# Patient Record
Sex: Male | Born: 1960 | Race: Black or African American | Hispanic: No | Marital: Single | Smoking: Current every day smoker
Health system: Southern US, Community
[De-identification: ages and names within clinical notes are randomized; demographics above are authoritative.]

## PROBLEM LIST (undated history)

## (undated) DIAGNOSIS — C61 Malignant neoplasm of prostate: Secondary | ICD-10-CM

## (undated) DIAGNOSIS — E7800 Pure hypercholesterolemia, unspecified: Secondary | ICD-10-CM

## (undated) DIAGNOSIS — I10 Essential (primary) hypertension: Secondary | ICD-10-CM

## (undated) HISTORY — PX: VASECTOMY: SHX75

## (undated) HISTORY — PX: RADIOACTIVE SEED IMPLANT: SHX5150

## (undated) HISTORY — PX: PROSTATE BIOPSY: SHX241

---

## 1991-03-27 HISTORY — PX: VASECTOMY: SHX75

## 2016-06-26 HISTORY — PX: COLONOSCOPY: SHX174

## 2019-10-03 HISTORY — PX: PROSTATE BIOPSY: SHX241

## 2019-12-17 HISTORY — PX: OTHER SURGICAL HISTORY: SHX169

## 2020-03-11 DIAGNOSIS — C61 Malignant neoplasm of prostate: Secondary | ICD-10-CM | POA: Insufficient documentation

## 2020-04-28 ENCOUNTER — Ambulatory Visit
Admission: RE | Admit: 2020-04-28 | Discharge: 2020-04-28 | Disposition: A | Discharge status: Home / Self Care | Payer: Self-pay | Source: Ambulatory Visit | Attending: Radiation Oncology | Admitting: Radiation Oncology

## 2020-04-28 ENCOUNTER — Other Ambulatory Visit: Payer: Self-pay | Admitting: Radiation Oncology

## 2020-04-28 ENCOUNTER — Telehealth: Payer: Self-pay | Admitting: *Deleted

## 2020-04-28 DIAGNOSIS — C61 Malignant neoplasm of prostate: Secondary | ICD-10-CM

## 2020-04-28 NOTE — Telephone Encounter (Signed)
Called patient to inform of  and sim for 04-29-20, spoke with patient and he is aware of these appts.

## 2020-04-29 ENCOUNTER — Institutional Professional Consult (permissible substitution): Payer: Self-pay | Admitting: Radiation Oncology

## 2020-04-29 ENCOUNTER — Encounter: Payer: Self-pay | Admitting: Radiation Oncology

## 2020-04-29 ENCOUNTER — Ambulatory Visit
Admission: RE | Admit: 2020-04-29 | Discharge: 2020-04-29 | Disposition: A | Discharge status: Home / Self Care | Payer: No Typology Code available for payment source | Source: Ambulatory Visit | Attending: Radiation Oncology | Admitting: Radiation Oncology

## 2020-04-29 ENCOUNTER — Other Ambulatory Visit: Payer: Self-pay

## 2020-04-29 VITALS — BP 126/78 | HR 67 | Temp 97.2°F | Resp 18 | Ht 66.0 in | Wt 187.0 lb

## 2020-04-29 DIAGNOSIS — C61 Malignant neoplasm of prostate: Secondary | ICD-10-CM

## 2020-04-29 DIAGNOSIS — Z510 Encounter for antineoplastic radiation therapy: Secondary | ICD-10-CM | POA: Insufficient documentation

## 2020-04-29 HISTORY — DX: Pure hypercholesterolemia, unspecified: E78.00

## 2020-04-29 HISTORY — DX: Malignant neoplasm of prostate: C61

## 2020-04-29 NOTE — Progress Notes (Signed)
GU Location of Tumor / Histology: prostatic adenocarcinoma  If Prostate Cancer, Gleason Score is (4 + 4) and PSA is (27.7)  Anthony Stokes presented to the [facility] hospital for routine physical in March 2021. His PSA was noted to be 27.7. He has been referred here for brachytherapy.  Biopsies of prostate (if applicable) revealed:    Past/Anticipated interventions by urology, if any: prostate biopsy, ADT, CT scan, bone scan, referral for radiation  Past/Anticipated interventions by medical oncology, if any: no  Weight changes, if any: no  Bowel/Bladder complaints, if any: IPSS 11. SHIM 1. No sexual intercourse since June 30 when he received ADT. Reports hot flashes associated with ADT. Denies dysuria, hematuria or urinary leakage. Denies diarrhea.   Nausea/Vomiting, if any: no  Pain issues, if any:  no  SAFETY ISSUES:  Prior radiation? yes  Pacemaker/ICD? no  Possible current pregnancy? no  Is the patient on methotrexate? no  Current Complaints / other details:  59 year old male. Resides in [place], [place].

## 2020-04-29 NOTE — Progress Notes (Signed)
Radiation Oncology         [id number]) [phone] ________________________________  Initial outpatient Consultation  Name: Anthony Stokes MRN: [id number]  Date: 04/29/2020  DOB: 1961-03-04  CC:White, French Ana, MD   REFERRING PHYSICIAN: Meda Klinefelter, MD  DIAGNOSIS: 59 y.o. gentleman with Stage T1C adenocarcinoma of the prostate with Gleason Score of 4+4, and PSA of 27.71.    ICD-10-CM   1. Malignant neoplasm of prostate ([place])  C61     HISTORY OF PRESENT ILLNESS: Anthony Stokes is a 59 y.o. male with a diagnosis of prostate cancer. He has a history of elevated PSA since at least 2018 when his PSA was 8.  He had an attempted  TRUSPBx in the office with Dr.  Sanjuana Letters at that time but was unable to tolerate the procedure.  The plan was to schedule prostate biopsy under anesthesia but the patient never followed up.  More recently, he was noted to have an elevated PSA of 27.71 by his primary care physician at the [facility].  Accordingly, he was referred for evaluation in urology by Dr. Eugenio Hoes in 08/2019,  digital rectal examination was performed at that time revealing no nodules or other concerning findings.  The patient proceeded to transrectal ultrasound with 12 biopsies of the prostate on 10/03/19.  We do not have documentation of the prostate volume.  Out of 12 core biopsies, 7 were positive.  The maximum Gleason score was 4+4, and this was seen in 2 cores from the right middle lobe, one core from the left base, and 2 cores from the left middle lobe.  Additionally, Gleason 4+3 was seen in 2 cores from the left apex.  He underwent CT A/P and bone scan for disease staging.  The bone scan was performed on 10/27/2019 and was suspicious for an lesion in the posterior lateral right eighth rib as well as increased uptake in the right cheek.  The patient reported no prior trauma.  CT A/P performed on 10/23/2019 was negative for evidence of lymphadenopathy, visceral or osseous metastatic disease.  The  patient proceeded to [place] PET scan for further evaluation of the abnormal findings on bone scan and this study did not show any evidence of metastatic disease.  He met with Dr. Derrill Center and Dr. Derinda Sis in radiation oncology at [facility], both of whom recommended LT-ADT concurrent with 5 weeks of daily EBRT to the prostate and pelvic lymph nodes followed by a brachytherapy boost procedure.  The patient started ADT on 12/24/2019.  He recently completed the 5-week course of daily radiotherapy on Monday, 04/26/2020 and unfortunately, Dr. Mina Marble is no longer with the radiation oncology department at [facility].  Therefore, Dr. Francesca Jewett reached out to [place] for consideration of the brachytherapy seed boost here in [place], [place].  The patient has kindly been referred today for discussion of a brachytherapy seed boost here, locally.   PREVIOUS RADIATION THERAPY: Yes 25 fractions of IMRT to the prostate and pelvic lymph nodes under the care of Dr. Derrill Center at [facility]).  PAST MEDICAL HISTORY:  Past Medical History:  Diagnosis Date  . Hypercholesterolemia   . Prostate cancer ([place])       PAST SURGICAL HISTORY: Past Surgical History:  Procedure Laterality Date  . COLONOSCOPY  2018  . PROSTATE BIOPSY    . VASECTOMY      FAMILY HISTORY:  Family History  Problem Relation Age of Onset  . Breast cancer Sister   . Uterine cancer Sister  SOCIAL HISTORY:  Social History   Socioeconomic History  . Marital status: Single    Spouse name: Not on file  . Number of children: Not on file  . Years of education: Not on file  . Highest education level: Not on file  Occupational History  . Not on file  Tobacco Use  . Smoking status: Current Every Day Smoker    Years: 15.00    Types: Cigars  . Smokeless tobacco: Never Used  Vaping Use  . Vaping Use: Never used  Substance and Sexual Activity  . Alcohol use: Never  . Drug use: Never  . Sexual activity: Not Currently  Other Topics Concern  . Not on  file  Social History Narrative  . Not on file   Social Determinants of Health   Financial Resource Strain:   . Difficulty of Paying Living Expenses: Not on file  Food Insecurity:   . Worried About Charity fundraiser in the Last Year: Not on file  . Ran Out of Food in the Last Year: Not on file  Transportation Needs:   . Lack of Transportation (Medical): Not on file  . Lack of Transportation (Non-Medical): Not on file  Physical Activity:   . Days of Exercise per Week: Not on file  . Minutes of Exercise per Session: Not on file  Stress:   . Feeling of Stress : Not on file  Social Connections:   . Frequency of Communication with Friends and Family: Not on file  . Frequency of Social Gatherings with Friends and Family: Not on file  . Attends Religious Services: Not on file  . Active Member of Clubs or Organizations: Not on file  . Attends Archivist Meetings: Not on file  . Marital Status: Not on file  Intimate Partner Violence:   . Fear of Current or Ex-Partner: Not on file  . Emotionally Abused: Not on file  . Physically Abused: Not on file  . Sexually Abused: Not on file    ALLERGIES: Patient has no known allergies.  MEDICATIONS:  Current Outpatient Medications  Medication Sig Dispense Refill  . atorvastatin (LIPITOR) 20 MG tablet Take by mouth.    . bicalutamide (CASODEX) 50 MG tablet Take by mouth.    Marland Kitchen lisinopril (ZESTRIL) 20 MG tablet Take by mouth.     No current facility-administered medications for this encounter.    REVIEW OF SYSTEMS:  On review of systems, the patient reports that he is doing well overall. He denies any chest pain, shortness of breath, cough, fevers, chills, night sweats, unintended weight changes. He denies any bowel disturbances, and denies abdominal pain, nausea or vomiting. He denies any new musculoskeletal or joint aches or pains. His IPSS was 11, indicating moderate urinary symptoms with increased frequency, hesitancy, weak stream  and nocturia x2. His SHIM was 1, indicating he has severe erectile dysfunction since starting ADT in June 2021. A complete review of systems is obtained and is otherwise negative.    PHYSICAL EXAM:  Wt Readings from Last 3 Encounters:  04/29/20 187 lb (84.8 kg)   Temp Readings from Last 3 Encounters:  04/29/20 (!) 97.2 F (36.2 C) (Temporal)   BP Readings from Last 3 Encounters:  04/29/20 126/78   Pulse Readings from Last 3 Encounters:  04/29/20 67   Pain Assessment Pain Score: 0-No pain/10  In general this is a well appearing African-American male in no acute distress. He is alert and oriented x4 and appropriate throughout the examination.  HEENT reveals that the patient is normocephalic, atraumatic. EOMs are intact. PERRLA. Skin is intact without any evidence of gross lesions. Cardiopulmonary assessment is negative for acute distress and breathing exhibits normal effort.  The abdomen is soft, non tender, non distended. Lower extremities are negative for pretibial pitting edema, deep calf tenderness, cyanosis or clubbing.   KPS = 100  100 - Normal; no complaints; no evidence of disease. 90   - Able to carry on normal activity; minor signs or symptoms of disease. 80   - Normal activity with effort; some signs or symptoms of disease. 77   - Cares for self; unable to carry on normal activity or to do active work. 60   - Requires occasional assistance, but is able to care for most of his personal needs. 50   - Requires considerable assistance and frequent medical care. 7   - Disabled; requires special care and assistance. 10   - Severely disabled; hospital admission is indicated although death not imminent. 54   - Very sick; hospital admission necessary; active supportive treatment necessary. 10   - Moribund; fatal processes progressing rapidly. 0     - Dead  Karnofsky DA, Abelmann WH, Craver LS and Burchenal JH [phone]) The use of the nitrogen mustards in the palliative treatment of  carcinoma: with particular reference to bronchogenic carcinoma Cancer 1 [phone]  LABORATORY DATA:  No results found for: WBC, HGB, HCT, MCV, PLT No results found for: NA, K, CL, CO2 No results found for: ALT, AST, GGT, ALKPHOS, BILITOT   RADIOGRAPHY: No results found.    IMPRESSION/PLAN: 1. 59 y.o. gentleman with Stage T1C adenocarcinoma of the prostate with Gleason Score of 4+4, and PSA of 27. We discussed the patient's workup and outlined the nature of prostate cancer in this setting. The patient's T stage, Gleason's score, and PSA put him into the high risk group. Accordingly, he is eligible for a variety of potential treatment options including prostatectomy, or LT-ADT in combination with either 8 weeks of external radiation or 5 weeks of external radiation followed by brachytherapy boost.  After a thorough discussion regarding the treatment options, the patient was adamant that he did not want to proceed with surgical prostatectomy and instead opted to proceed with the ADT concurrent with combination radiation therapy with prostate IMRT followed by brachytherapy boost, initially planning to complete treatment with his doctors at [facility].  He has since completed the 5 weeks of prostate and pelvic IMRT but Dr. Mina Marble has left the radiation oncology department at [facility] and therefore unavailable to provide the brachytherapy boost procedure.  Today, we discussed the available radiation techniques, and focused on the details and logistics of delivery as it pertains to brachytherapy.  We discussed and outlined the risks, benefits, short and long-term effects associated with radiotherapy and compared and contrasted these with prostatectomy. We discussed the role of SpaceOAR gel in reducing the rectal toxicity associated with radiotherapy.  The patient is well-informed regarding the role of ADT in the treatment of high risk prostate cancer and the associated side effects that could be expected with this therapy.  He  was started on ADT on 12/24/2019 and continues to tolerate this fairly well despite periodic hot flashes, decreased stamina and significantly decreased libido.  He appears to have a good understanding of his disease and our treatment recommendations which are of curative intent.  He was encouraged to ask questions that were answered to his stated satisfaction.  At the conclusion of our conversation,  the patient is interested in moving forward with the recommended brachytherapy boost and use of SpaceOAR gel to reduce rectal toxicity from radiotherapy.  We will share our discussion with his medical team at [facility] as well as coordinate this procedure with one of the local urologists at [facility] here in [place]. He has freely provided written consent to proceed today in the office and a copy of this document will be placed in his medical record.  He will have the CT SIM pubic arch study today, following our visit in preparation for proceeding with the brachytherapy boost procedure in the near future.  The patient met briefly with Romie Jumper in our office who will be working closely with him to coordinate OR scheduling and pre and post procedure appointments.  We will contact the pharmaceutical rep to ensure that [employer] is available at the time of procedure.  We enjoyed meeting him today and look forward to continuing to participate in his care.   Nicholos Johns, PA-C    Tyler Pita, MD  [facility] Direct Dial: [phone]  Fax: [phone] [email or web address]  Skype  LinkedIn

## 2020-05-03 ENCOUNTER — Telehealth: Payer: Self-pay | Admitting: *Deleted

## 2020-05-03 NOTE — Telephone Encounter (Signed)
RETURNED PATIENT'S PHONE CALL, SPOKE WITH PATIENT. ?

## 2020-05-04 ENCOUNTER — Other Ambulatory Visit: Payer: Self-pay | Admitting: Radiation Oncology

## 2020-05-04 DIAGNOSIS — C61 Malignant neoplasm of prostate: Secondary | ICD-10-CM

## 2020-05-06 ENCOUNTER — Telehealth: Payer: Self-pay | Admitting: *Deleted

## 2020-05-06 NOTE — Progress Notes (Signed)
  Radiation Oncology         [phone] ________________________________  Name: Anthony Stokes MRN: [id number]  Date: 04/29/2020  DOB: 23-Mar-1961  SIMULATION AND TREATMENT PLANNING NOTE PUBIC ARCH STUDY  HK:VQQVZ, Fortino Sic, LMFT  Meda Klinefelter, MD  DIAGNOSIS:  Oncology History  Prostate cancer [facility])  09/04/2019 Cancer Staging   Staging form: Prostate, AJCC 8th Edition - Clinical stage from 09/04/2019: Stage IIIA (cT1c, cN0, cM0, PSA: 27, Grade Group: 4) - Signed by Freeman Caldron, PA-C on 04/29/2020   03/11/2020 Initial Diagnosis   Prostate cancer ([place])       ICD-10-CM   1. Prostate cancer ([place])  C61     COMPLEX SIMULATION:  The patient presented today for evaluation for possible prostate seed implant. He was brought to the radiation planning suite and placed supine on the CT couch. A 3-dimensional image study set was obtained in upload to the planning computer. There, on each axial slice, I contoured the prostate gland. Then, using three-dimensional radiation planning tools I reconstructed the prostate in view of the structures from the transperineal needle pathway to assess for possible pubic arch interference. In doing so, I did not appreciate any pubic arch interference. Also, the patient's prostate volume was estimated based on the drawn structure. The volume was 21.2 cc.  Given the pubic arch appearance and prostate volume, patient remains a good candidate to proceed with prostate seed implant. Today, he freely provided informed written consent to proceed.    PLAN: The patient will undergo prostate seed implant.   ________________________________  Sheral Apley. Tammi Klippel, M.D.

## 2020-05-06 NOTE — Telephone Encounter (Signed)
Called patient to inform of implant date, spoke with patient 

## 2020-05-09 NOTE — Addendum Note (Signed)
Encounter addended by: Tyler Pita, MD on: 05/09/2020 3:27 PM  Actions taken: Medication List reviewed, Problem List reviewed, Allergies reviewed, Visit diagnoses modified

## 2020-05-10 ENCOUNTER — Other Ambulatory Visit: Payer: Self-pay | Admitting: Urology

## 2020-05-12 ENCOUNTER — Telehealth: Payer: Self-pay | Admitting: *Deleted

## 2020-05-12 NOTE — Telephone Encounter (Signed)
CALLED PATIENT TO REMIND OF LAB AND COVID TESTING, SPOKE WITH PATIENT AND HE IS AWARE OF THESE APPTS. 

## 2020-05-13 ENCOUNTER — Encounter (HOSPITAL_BASED_OUTPATIENT_CLINIC_OR_DEPARTMENT_OTHER): Payer: Self-pay | Admitting: Urology

## 2020-05-13 ENCOUNTER — Other Ambulatory Visit: Payer: Self-pay

## 2020-05-13 NOTE — Progress Notes (Signed)
Spoke w/ via phone for pre-op interview---pt Lab needs dos----none has lab appt 05-17-2020 at 1100 am for ekg, chest xray, cbc, cmet, pt, ptt               Lab results------none COVID test ------05-21-2020 at 1000 Arrive at -------900 am 05-24-2020 NPO after MN NO Solid Food.  Clear liquids from MN until---800 am then npo Medications to take morning of surgery -----pt wishes to take medications after surgery Diabetic medication -----n/a Patient Special Instructions -----fleets enema am of surgery Pre-Op special Istructions -----none Patient verbalized understanding of instructions that were given at this phone interview. Patient denies shortness of breath, chest pain, fever, cough at this phone interview.

## 2020-05-17 ENCOUNTER — Ambulatory Visit (HOSPITAL_COMMUNITY)
Admission: RE | Admit: 2020-05-17 | Discharge: 2020-05-17 | Disposition: A | Discharge status: Home / Self Care | Payer: No Typology Code available for payment source | Source: Ambulatory Visit | Attending: Urology | Admitting: Urology

## 2020-05-17 ENCOUNTER — Encounter (HOSPITAL_COMMUNITY)
Admission: RE | Admit: 2020-05-17 | Discharge: 2020-05-17 | Disposition: A | Discharge status: Home / Self Care | Payer: No Typology Code available for payment source | Source: Ambulatory Visit | Attending: Urology | Admitting: Urology

## 2020-05-17 ENCOUNTER — Other Ambulatory Visit: Payer: Self-pay

## 2020-05-17 DIAGNOSIS — Z01818 Encounter for other preprocedural examination: Secondary | ICD-10-CM | POA: Diagnosis not present

## 2020-05-17 LAB — COMPREHENSIVE METABOLIC PANEL
ALT: 31 U/L (ref 0–44)
AST: 21 U/L (ref 15–41)
Albumin: 4.7 g/dL (ref 3.5–5.0)
Alkaline Phosphatase: 72 U/L (ref 38–126)
Anion gap: 8 (ref 5–15)
BUN: 15 mg/dL (ref 6–20)
CO2: 27 mmol/L (ref 22–32)
Calcium: 9.9 mg/dL (ref 8.9–10.3)
Chloride: 103 mmol/L (ref 98–111)
Creatinine, Ser: 1.09 mg/dL (ref 0.61–1.24)
GFR, Estimated: >60 mL/min (ref >60)
Glucose, Bld: 99 mg/dL (ref 70–99)
Potassium: 4.9 mmol/L (ref 3.5–5.1)
Sodium: 138 mmol/L (ref 135–145)
Total Bilirubin: 0.7 mg/dL (ref 0.3–1.2)
Total Protein: 8.4 g/dL — ABNORMAL HIGH (ref 6.5–8.1)

## 2020-05-17 LAB — CBC
HCT: 34.1 % — ABNORMAL LOW (ref 39.0–52.0)
Hemoglobin: 11.5 g/dL — ABNORMAL LOW (ref 13.0–17.0)
MCH: 30.8 pg (ref 26.0–34.0)
MCHC: 33.7 g/dL (ref 30.0–36.0)
MCV: 91.4 fL (ref 80.0–100.0)
Platelets: 255 10*3/uL (ref 150–400)
RBC: 3.73 MIL/uL — ABNORMAL LOW (ref 4.22–5.81)
RDW: 15 % (ref 11.5–15.5)
WBC: 4.3 10*3/uL (ref 4.0–10.5)
nRBC: 0 % (ref 0.0–0.2)

## 2020-05-17 LAB — PROTIME-INR
INR: 1 (ref 0.8–1.2)
Prothrombin Time: 12.9 seconds (ref 11.4–15.2)

## 2020-05-17 LAB — APTT: aPTT: 31 seconds (ref 24–36)

## 2020-05-18 ENCOUNTER — Ambulatory Visit: Payer: Self-pay

## 2020-05-18 ENCOUNTER — Ambulatory Visit: Payer: Self-pay | Admitting: Radiation Oncology

## 2020-05-19 ENCOUNTER — Telehealth: Payer: Self-pay | Admitting: *Deleted

## 2020-05-19 NOTE — Telephone Encounter (Signed)
CALLED PATIENT TO REMIND OF IMPLANT FOR 05-24-20 AND HIS POST SEED APPTS. FOR 05-24-20, SPOKE WITH PATIENT AND HE IS AWARE OF THIS PROCEDURE AND  HIS POST SEED APPTS.

## 2020-05-21 ENCOUNTER — Other Ambulatory Visit (HOSPITAL_COMMUNITY)
Admission: RE | Admit: 2020-05-21 | Discharge: 2020-05-21 | Disposition: A | Discharge status: Home / Self Care | Payer: No Typology Code available for payment source | Source: Ambulatory Visit | Attending: Urology | Admitting: Urology

## 2020-05-21 DIAGNOSIS — Z01812 Encounter for preprocedural laboratory examination: Secondary | ICD-10-CM | POA: Insufficient documentation

## 2020-05-21 DIAGNOSIS — Z20822 Contact with and (suspected) exposure to covid-19: Secondary | ICD-10-CM | POA: Insufficient documentation

## 2020-05-21 LAB — SARS CORONAVIRUS 2 (TAT 6-24 HRS): SARS Coronavirus 2: NEGATIVE

## 2020-05-24 ENCOUNTER — Ambulatory Visit
Admission: RE | Admit: 2020-05-24 | Discharge: 2020-05-24 | Disposition: A | Discharge status: Home / Self Care | Payer: No Typology Code available for payment source | Source: Ambulatory Visit | Attending: Radiation Oncology | Admitting: Radiation Oncology

## 2020-05-24 ENCOUNTER — Ambulatory Visit (HOSPITAL_BASED_OUTPATIENT_CLINIC_OR_DEPARTMENT_OTHER): Payer: No Typology Code available for payment source | Admitting: Certified Registered"

## 2020-05-24 ENCOUNTER — Ambulatory Visit (HOSPITAL_BASED_OUTPATIENT_CLINIC_OR_DEPARTMENT_OTHER)
Admission: RE | Admit: 2020-05-24 | Discharge: 2020-05-24 | Disposition: A | Discharge status: Home / Self Care | Payer: No Typology Code available for payment source | Attending: Urology | Admitting: Urology

## 2020-05-24 ENCOUNTER — Other Ambulatory Visit: Payer: Self-pay

## 2020-05-24 ENCOUNTER — Ambulatory Visit (HOSPITAL_COMMUNITY): Payer: No Typology Code available for payment source

## 2020-05-24 ENCOUNTER — Encounter: Payer: Self-pay | Admitting: Radiation Oncology

## 2020-05-24 ENCOUNTER — Encounter (HOSPITAL_BASED_OUTPATIENT_CLINIC_OR_DEPARTMENT_OTHER)
Admission: RE | Disposition: A | Discharge status: Home / Self Care | Payer: Self-pay | Source: Home / Self Care | Attending: Urology

## 2020-05-24 ENCOUNTER — Encounter (HOSPITAL_BASED_OUTPATIENT_CLINIC_OR_DEPARTMENT_OTHER): Payer: Self-pay | Admitting: Urology

## 2020-05-24 DIAGNOSIS — Z8049 Family history of malignant neoplasm of other genital organs: Secondary | ICD-10-CM | POA: Insufficient documentation

## 2020-05-24 DIAGNOSIS — C61 Malignant neoplasm of prostate: Secondary | ICD-10-CM

## 2020-05-24 DIAGNOSIS — Z9852 Vasectomy status: Secondary | ICD-10-CM | POA: Insufficient documentation

## 2020-05-24 DIAGNOSIS — R350 Frequency of micturition: Secondary | ICD-10-CM | POA: Insufficient documentation

## 2020-05-24 DIAGNOSIS — F17290 Nicotine dependence, other tobacco product, uncomplicated: Secondary | ICD-10-CM | POA: Diagnosis not present

## 2020-05-24 DIAGNOSIS — Z01818 Encounter for other preprocedural examination: Secondary | ICD-10-CM

## 2020-05-24 DIAGNOSIS — R3911 Hesitancy of micturition: Secondary | ICD-10-CM | POA: Insufficient documentation

## 2020-05-24 DIAGNOSIS — Z803 Family history of malignant neoplasm of breast: Secondary | ICD-10-CM | POA: Insufficient documentation

## 2020-05-24 DIAGNOSIS — Z79899 Other long term (current) drug therapy: Secondary | ICD-10-CM | POA: Insufficient documentation

## 2020-05-24 DIAGNOSIS — Z923 Personal history of irradiation: Secondary | ICD-10-CM | POA: Insufficient documentation

## 2020-05-24 HISTORY — DX: Essential (primary) hypertension: I10

## 2020-05-24 HISTORY — PX: RADIOACTIVE SEED IMPLANT: SHX5150

## 2020-05-24 HISTORY — PX: SPACE OAR INSTILLATION: SHX6769

## 2020-05-24 HISTORY — PX: CYSTOSCOPY: SHX5120

## 2020-05-24 SURGERY — INSERTION, RADIATION SOURCE, PROSTATE
Anesthesia: General | Site: Perineum

## 2020-05-24 MED ORDER — ONDANSETRON HCL 4 MG/2ML IJ SOLN
INTRAMUSCULAR | Status: AC
  Filled 2020-05-24: qty 2

## 2020-05-24 MED ORDER — MIDAZOLAM HCL 2 MG/2ML IJ SOLN
INTRAMUSCULAR | Status: AC
  Filled 2020-05-24: qty 2

## 2020-05-24 MED ORDER — FLEET ENEMA 7-19 GM/118ML RE ENEM: 1.0000 | ENEMA | Freq: Once | RECTAL | Status: DC

## 2020-05-24 MED ORDER — ONDANSETRON HCL 4 MG/2ML IJ SOLN
INTRAMUSCULAR | Status: DC | PRN
  Administered 2020-05-24: 4 mg via INTRAVENOUS

## 2020-05-24 MED ORDER — PROPOFOL 10 MG/ML IV BOLUS
INTRAVENOUS | Status: AC
  Filled 2020-05-24: qty 20

## 2020-05-24 MED ORDER — LACTATED RINGERS IV SOLN: INTRAVENOUS | Status: DC

## 2020-05-24 MED ORDER — DEXAMETHASONE SODIUM PHOSPHATE 10 MG/ML IJ SOLN
INTRAMUSCULAR | Status: AC
  Filled 2020-05-24: qty 1

## 2020-05-24 MED ORDER — CEFAZOLIN SODIUM-DEXTROSE 2-4 GM/100ML-% IV SOLN
2.0000 g | Freq: Once | INTRAVENOUS | Status: AC
  Administered 2020-05-24: 2 g via INTRAVENOUS

## 2020-05-24 MED ORDER — MIDAZOLAM HCL 2 MG/2ML IJ SOLN
INTRAMUSCULAR | Status: DC | PRN
  Administered 2020-05-24: 2 mg via INTRAVENOUS

## 2020-05-24 MED ORDER — TRAMADOL HCL 50 MG PO TABS
50.0000 mg | ORAL_TABLET | Freq: Four times a day (QID) | ORAL | 0 refills | Status: AC | PRN
Start: 2020-05-24 — End: 2021-05-24

## 2020-05-24 MED ORDER — DEXAMETHASONE SODIUM PHOSPHATE 10 MG/ML IJ SOLN
INTRAMUSCULAR | Status: DC | PRN
  Administered 2020-05-24 (×2): 5 mg via INTRAVENOUS

## 2020-05-24 MED ORDER — CEFAZOLIN SODIUM-DEXTROSE 2-4 GM/100ML-% IV SOLN
INTRAVENOUS | Status: AC
  Filled 2020-05-24: qty 100

## 2020-05-24 MED ORDER — LIDOCAINE 2% (20 MG/ML) 5 ML SYRINGE
INTRAMUSCULAR | Status: DC | PRN
  Administered 2020-05-24: 100 mg via INTRAVENOUS

## 2020-05-24 MED ORDER — STERILE WATER FOR IRRIGATION IR SOLN
Status: DC | PRN
  Administered 2020-05-24: 500 mL

## 2020-05-24 MED ORDER — TAMSULOSIN HCL 0.4 MG PO CAPS: 0.4000 mg | ORAL_CAPSULE | Freq: Every day | ORAL | 0 refills | Status: AC

## 2020-05-24 MED ORDER — FENTANYL CITRATE (PF) 100 MCG/2ML IJ SOLN
INTRAMUSCULAR | Status: AC
  Filled 2020-05-24: qty 2

## 2020-05-24 MED ORDER — SODIUM CHLORIDE 0.9 % IV SOLN
INTRAVENOUS | Status: AC | PRN
  Administered 2020-05-24: 1000 mL

## 2020-05-24 MED ORDER — PHENYLEPHRINE 40 MCG/ML (10ML) SYRINGE FOR IV PUSH (FOR BLOOD PRESSURE SUPPORT)
PREFILLED_SYRINGE | INTRAVENOUS | Status: AC
  Filled 2020-05-24: qty 10

## 2020-05-24 MED ORDER — PROPOFOL 10 MG/ML IV BOLUS
INTRAVENOUS | Status: DC | PRN
  Administered 2020-05-24: 150 mg via INTRAVENOUS

## 2020-05-24 MED ORDER — PHENYLEPHRINE 40 MCG/ML (10ML) SYRINGE FOR IV PUSH (FOR BLOOD PRESSURE SUPPORT)
PREFILLED_SYRINGE | INTRAVENOUS | Status: DC | PRN
  Administered 2020-05-24: 80 ug via INTRAVENOUS

## 2020-05-24 MED ORDER — FENTANYL CITRATE (PF) 100 MCG/2ML IJ SOLN
INTRAMUSCULAR | Status: DC | PRN
  Administered 2020-05-24 (×4): 25 ug via INTRAVENOUS
  Administered 2020-05-24: 50 ug via INTRAVENOUS

## 2020-05-24 MED ORDER — SODIUM CHLORIDE (PF) 0.9 % IJ SOLN
INTRAMUSCULAR | Status: DC | PRN
  Administered 2020-05-24: 10 mL via INTRAVENOUS

## 2020-05-24 MED ORDER — IOHEXOL 300 MG/ML  SOLN
INTRAMUSCULAR | Status: DC | PRN
  Administered 2020-05-24: 7 mL

## 2020-05-24 MED ORDER — LIDOCAINE HCL (PF) 2 % IJ SOLN
INTRAMUSCULAR | Status: AC
  Filled 2020-05-24: qty 5

## 2020-05-24 SURGICAL SUPPLY — 38 items
BAG DRN RND TRDRP ANRFLXCHMBR (UROLOGICAL SUPPLIES) ×2
BAG URINE DRAIN 2000ML AR STRL (UROLOGICAL SUPPLIES) ×3 IMPLANT
BLADE CLIPPER SENSICLIP SURGIC (BLADE) ×3 IMPLANT
CATH FOLEY 2WAY SLVR  5CC 16FR (CATHETERS) ×1
CATH FOLEY 2WAY SLVR 5CC 16FR (CATHETERS) ×2 IMPLANT
CATH ROBINSON RED A/P 16FR (CATHETERS) IMPLANT
CATH ROBINSON RED A/P 20FR (CATHETERS) ×3 IMPLANT
CLOTH BEACON ORANGE TIMEOUT ST (SAFETY) ×3 IMPLANT
CNTNR URN SCR LID CUP LEK RST (MISCELLANEOUS) ×4 IMPLANT
CONT SPEC 4OZ STRL OR WHT (MISCELLANEOUS) ×6
COVER BACK TABLE 60X90IN (DRAPES) ×3 IMPLANT
COVER MAYO STAND STRL (DRAPES) ×3 IMPLANT
DRAPE C-ARM 35X43 STRL (DRAPES) ×3 IMPLANT
DRSG TEGADERM 4X4.75 (GAUZE/BANDAGES/DRESSINGS) ×3 IMPLANT
DRSG TEGADERM 8X12 (GAUZE/BANDAGES/DRESSINGS) ×6 IMPLANT
GAUZE SPONGE 4X4 12PLY STRL LF (GAUZE/BANDAGES/DRESSINGS) ×3 IMPLANT
GLOVE BIO SURGEON STRL SZ 6.5 (GLOVE) ×3 IMPLANT
GLOVE BIO SURGEON STRL SZ7.5 (GLOVE) ×6 IMPLANT
GLOVE BIO SURGEON STRL SZ8 (GLOVE) IMPLANT
GLOVE SURG ORTHO 8.5 STRL (GLOVE) ×3 IMPLANT
GLOVE SURG SS PI 6.5 STRL IVOR (GLOVE) ×3 IMPLANT
GOWN STRL REUS W/TWL XL LVL3 (GOWN DISPOSABLE) ×3 IMPLANT
HOLDER FOLEY CATH W/STRAP (MISCELLANEOUS) ×3 IMPLANT
IMPL SPACEOAR VUE SYSTEM (Spacer) ×2 IMPLANT
IMPLANT SPACEOAR VUE SYSTEM (Spacer) ×3 IMPLANT
IV NS 1000ML (IV SOLUTION) ×3
IV NS 1000ML BAXH (IV SOLUTION) ×2 IMPLANT
KIT TURNOVER CYSTO (KITS) ×3 IMPLANT
MARKER SKIN DUAL TIP RULER LAB (MISCELLANEOUS) ×3 IMPLANT
PACK CYSTO (CUSTOM PROCEDURE TRAY) ×3 IMPLANT
SURGILUBE 2OZ TUBE FLIPTOP (MISCELLANEOUS) ×3 IMPLANT
SUT BONE WAX W31G (SUTURE) IMPLANT
SYR 10ML LL (SYRINGE) IMPLANT
SYR 20ML LL LF (SYRINGE) IMPLANT
TOWEL OR 17X26 10 PK STRL BLUE (TOWEL DISPOSABLE) ×3 IMPLANT
UNDERPAD 30X36 HEAVY ABSORB (UNDERPADS AND DIAPERS) ×6 IMPLANT
WATER STERILE IRR 500ML POUR (IV SOLUTION) ×3 IMPLANT
i-seed agx100 ×180 IMPLANT

## 2020-05-24 NOTE — Transfer of Care (Signed)
Immediate Anesthesia Transfer of Care Note  Patient: Anthony Stokes  Procedure(s) Performed: Procedure(s) (LRB): RADIOACTIVE SEED IMPLANT/BRACHYTHERAPY IMPLANT (N/A) SPACE OAR INSTILLATION (N/A) CYSTOSCOPY (N/A)  Patient Location: PACU  Anesthesia Type: General  Level of Consciousness: awake, oriented, sedated and patient cooperative  Airway & Oxygen Therapy: Patient Spontanous Breathing and Patient connected to face mask oxygen  Post-op Assessment: Report given to PACU RN and Post -op Vital signs reviewed and stable  Post vital signs: Reviewed and stable  Complications: No apparent anesthesia complications  Last Vitals:  Vitals Value Taken Time  BP 190/113 05/24/20 1246  Temp    Pulse    Resp 20 05/24/20 1247  SpO2    Vitals shown include unvalidated device data.  Last Pain:  Vitals:   05/24/20 0849  TempSrc: Oral         Complications: No complications documented.

## 2020-05-24 NOTE — Discharge Instructions (Signed)

## 2020-05-24 NOTE — Anesthesia Procedure Notes (Signed)
Procedure Name: LMA Insertion Date/Time: 05/24/2020 11:20 AM Performed by: Suan Halter, CRNA Pre-anesthesia Checklist: Patient identified, Emergency Drugs available, Suction available and Patient being monitored Patient Re-evaluated:Patient Re-evaluated prior to induction Oxygen Delivery Method: Circle system utilized Preoxygenation: Pre-oxygenation with 100% oxygen Induction Type: IV induction Ventilation: Mask ventilation without difficulty LMA: LMA inserted LMA Size: 4.0 Number of attempts: 1 Airway Equipment and Method: Bite block Placement Confirmation: positive ETCO2 Tube secured with: Tape Dental Injury: Teeth and Oropharynx as per pre-operative assessment

## 2020-05-24 NOTE — H&P (Addendum)
DIAGNOSIS: 59 y.o. gentleman with Stage T1C adenocarcinoma of the prostate with Gleason Score of 4+4, and PSA of 27.71.       ICD-10-CM   1. Malignant neoplasm of prostate ([place])  C61       HISTORY OF PRESENT ILLNESS: Anthony Stokes is a 59 y.o. male with a diagnosis of prostate cancer. He has a history of elevated PSA since at least 2018 when his PSA was 8.  He had an attempted  TRUSPBx in the office with Dr.  Sanjuana Letters at that time but was unable to tolerate the procedure.  The plan was to schedule prostate biopsy under anesthesia but the patient never followed up.  More recently, he was noted to have an elevated PSA of 27.71 by his primary care physician at the [facility].  Accordingly, he was referred for evaluation in urology by Dr. Eugenio Hoes in 08/2019,  digital rectal examination was performed at that time revealing no nodules or other concerning findings.  The patient proceeded to transrectal ultrasound with 12 biopsies of the prostate on 10/03/19.  We do not have documentation of the prostate volume.  Out of 12 core biopsies, 7 were positive.  The maximum Gleason score was 4+4, and this was seen in 2 cores from the right middle lobe, one core from the left base, and 2 cores from the left middle lobe.  Additionally, Gleason 4+3 was seen in 2 cores from the left apex.   He underwent CT A/P and bone scan for disease staging.  The bone scan was performed on 10/27/2019 and was suspicious for an lesion in the posterior lateral right eighth rib as well as increased uptake in the right cheek.  The patient reported no prior trauma.  CT A/P performed on 10/23/2019 was negative for evidence of lymphadenopathy, visceral or osseous metastatic disease.  The patient proceeded to [place] PET scan for further evaluation of the abnormal findings on bone scan and this study did not show any evidence of metastatic disease.  He met with Dr. Derrill Center and Dr. Derinda Sis in radiation oncology at [facility], both of whom recommended LT-ADT concurrent  with 5 weeks of daily EBRT to the prostate and pelvic lymph nodes followed by a brachytherapy boost procedure.  The patient started ADT on 12/24/2019.  He recently completed the 5-week course of daily radiotherapy on Monday, 04/26/2020 and unfortunately, Dr. Mina Marble is no longer with the radiation oncology department at [facility].  Therefore, Dr. Francesca Jewett reached out to [place] for consideration of the brachytherapy seed boost here in [place], [place].   The patient has kindly been referred today for discussion of a brachytherapy seed boost here, locally.  -05/24/20-History as above, here for interstitial brachtherapy/Space OARS gel implant   PREVIOUS RADIATION THERAPY: Yes 25 fractions of IMRT to the prostate and pelvic lymph nodes under the care of Dr. Derrill Center at [facility]).   PAST MEDICAL HISTORY:  Past Medical History: Diagnosis Date . Hypercholesterolemia   . Prostate cancer ([place])        PAST SURGICAL HISTORY: Past Surgical History: Procedure Laterality Date . COLONOSCOPY   2018 . PROSTATE BIOPSY     . VASECTOMY         FAMILY HISTORY:  Family History Problem Relation Age of Onset . Breast cancer Sister   . Uterine cancer Sister       SOCIAL HISTORY:  Social History   Socioeconomic History . Marital status: Single     Spouse name: Not on file . Number of  children: Not on file . Years of education: Not on file . Highest education level: Not on file Occupational History . Not on file Tobacco Use . Smoking status: Current Every Day Smoker     Years: 15.00     Types: Cigars . Smokeless tobacco: Never Used Vaping Use . Vaping Use: Never used Substance and Sexual Activity . Alcohol use: Never . Drug use: Never . Sexual activity: Not Currently Other Topics Concern . Not on file Social History Narrative . Not on file   Social Determinants of Health   Financial Resource Strain:  . Difficulty of Paying Living Expenses: Not on file Food Insecurity:  . Worried About Paediatric nurse in the Last Year: Not on file . Ran Out of Food in the Last Year: Not on file Transportation Needs:  . Lack of Transportation (Medical): Not on file . Lack of Transportation (Non-Medical): Not on file Physical Activity:  . Days of Exercise per Week: Not on file . Minutes of Exercise per Session: Not on file Stress:  . Feeling of Stress : Not on file Social Connections:  . Frequency of Communication with Friends and Family: Not on file . Frequency of Social Gatherings with Friends and Family: Not on file . Attends Religious Services: Not on file . Active Member of Clubs or Organizations: Not on file . Attends Archivist Meetings: Not on file . Marital Status: Not on file Intimate Partner Violence:  . Fear of Current or Ex-Partner: Not on file . Emotionally Abused: Not on file . Physically Abused: Not on file . Sexually Abused: Not on file     ALLERGIES: Patient has no known allergies.   MEDICATIONS:  Current Outpatient Medications Medication Sig Dispense Refill . atorvastatin (LIPITOR) 20 MG tablet Take by mouth.     . bicalutamide (CASODEX) 50 MG tablet Take by mouth.     Marland Kitchen lisinopril (ZESTRIL) 20 MG tablet Take by mouth.       No current facility-administered medications for this encounter.     REVIEW OF SYSTEMS:  On review of systems, the patient reports that he is doing well overall. He denies any chest pain, shortness of breath, cough, fevers, chills, night sweats, unintended weight changes. He denies any bowel disturbances, and denies abdominal pain, nausea or vomiting. He denies any new musculoskeletal or joint aches or pains. His IPSS was 11, indicating moderate urinary symptoms with increased frequency, hesitancy, weak stream and nocturia x2. His SHIM was 1, indicating he has severe erectile dysfunction since starting ADT in June 2021. A complete review of systems is obtained and is otherwise negative.     PHYSICAL EXAM:  Wt Readings from Last 3  Encounters: 04/29/20 187 lb (84.8 kg)   Temp Readings from Last 3 Encounters: 04/29/20 (!) 97.2 F (36.2 C) (Temporal)   BP Readings from Last 3 Encounters: 04/29/20 126/78   Pulse Readings from Last 3 Encounters: 04/29/20 67   Pain Assessment Pain Score: 0-No pain/10   In general this is a well appearing African-American male in no acute distress. He is alert and oriented x4 and appropriate throughout the examination. HEENT reveals that the patient is normocephalic, atraumatic. EOMs are intact. PERRLA. Skin is intact without any evidence of gross lesions. Cardiopulmonary assessment is negative for acute distress and breathing exhibits normal effort.  The abdomen is soft, non tender, non distended. Lower extremities are negative for pretibial pitting edema, deep calf tenderness, cyanosis or clubbing.     KPS =  100   100 - Normal; no complaints; no evidence of disease. 90   - Able to carry on normal activity; minor signs or symptoms of disease. 80   - Normal activity with effort; some signs or symptoms of disease. 44   - Cares for self; unable to carry on normal activity or to do active work. 60   - Requires occasional assistance, but is able to care for most of his personal needs. 50   - Requires considerable assistance and frequent medical care. 46   - Disabled; requires special care and assistance. 34   - Severely disabled; hospital admission is indicated although death not imminent. 48   - Very sick; hospital admission necessary; active supportive treatment necessary. 10   - Moribund; fatal processes progressing rapidly. 0     - Dead   Karnofsky DA, Abelmann WH, Craver LS and Burchenal JH [phone]) The use of the nitrogen mustards in the palliative treatment of carcinoma: with particular reference to bronchogenic carcinoma Cancer 1 [id number]   LABORATORY DATA:  Recent Labs No results found for: WBC, HGB, HCT, MCV, PLT  Recent Labs No results found for: NA, K, CL, CO2  Recent  Labs No results found for: ALT, AST, GGT, ALKPHOS, BILITOT    RADIOGRAPHY:  Imaging Results No results found.     IMPRESSION/PLAN: 1.         59 y.o. gentleman with Stage T1C adenocarcinoma of the prostate with Gleason Score of 4+4, and PSA of 27. We discussed the patient's workup and outlined the nature of prostate cancer in this setting. The patient's T stage, Gleason's score, and PSA put him into the high risk group. Accordingly, he is eligible for a variety of potential treatment options including prostatectomy, or LT-ADT in combination with either 8 weeks of external radiation or 5 weeks of external radiation followed by brachytherapy boost.  After a thorough discussion regarding the treatment options, the patient was adamant that he did not want to proceed with surgical prostatectomy and instead opted to proceed with the ADT concurrent with combination radiation therapy with prostate IMRT followed by brachytherapy boost, initially planning to complete treatment with his doctors at [facility].  He has since completed the 5 weeks of prostate and pelvic IMRT but Dr. Mina Marble has left the radiation oncology department at [facility] and therefore unavailable to provide the brachytherapy boost procedure.  Today, we discussed the available radiation techniques, and focused on the details and logistics of delivery as it pertains to brachytherapy.  We discussed and outlined the risks, benefits, short and long-term effects associated with radiotherapy and compared and contrasted these with prostatectomy. We discussed the role of SpaceOAR gel in reducing the rectal toxicity associated with radiotherapy.  The patient is well-informed regarding the role of ADT in the treatment of high risk prostate cancer and the associated side effects that could be expected with this therapy.  He was started on ADT on 12/24/2019 and continues to tolerate this fairly well despite periodic hot flashes, decreased stamina and significantly decreased  libido.  He appears to have a good understanding of his disease and our treatment recommendations which are of curative intent.  He was encouraged to ask questions that were answered to his stated satisfaction.   At the conclusion of our conversation, the patient is interested in moving forward with the recommended brachytherapy boost and use of SpaceOAR gel to reduce rectal toxicity from radiotherapy.  We will share our discussion with his medical team at [facility] as well as coordinate this procedure with one of the local urologists at [facility] here in [place]. He has freely provided written consent to proceed today in the office and a copy of this document will be placed in his medical record.  He will have the CT SIM pubic arch study today, following our visit in preparation for proceeding with the brachytherapy boost procedure in the near future.  The patient met briefly with Romie Jumper in our office who will be working closely with him to coordinate OR scheduling and pre and post procedure appointments.  We will contact the pharmaceutical rep to ensure that Dailey is available at the time of procedure  Imp: [place] Ca of prostate as above s/p IMRT, now for brachytherapy boost Plan. I125 interstitial braqchytherapy

## 2020-05-24 NOTE — Anesthesia Preprocedure Evaluation (Addendum)
Anesthesia Evaluation  Patient identified by MRN, date of birth, ID band Patient awake    Reviewed: Allergy & Precautions, NPO status , Patient's Chart, lab work & pertinent test results  Airway Mallampati: II  TM Distance: >3 FB Neck ROM: Full    Dental no notable dental hx.    Pulmonary neg pulmonary ROS, Current Smoker and Patient abstained from smoking.,    Pulmonary exam normal breath sounds clear to auscultation       Cardiovascular Exercise Tolerance: Good hypertension, Normal cardiovascular exam Rhythm:Regular Rate:Normal     Neuro/Psych negative neurological ROS  negative psych ROS   GI/Hepatic negative GI ROS, Neg liver ROS,   Endo/Other  negative endocrine ROS  Renal/GU    Prostate ca    Musculoskeletal negative musculoskeletal ROS (+)   Abdominal Normal abdominal exam  (+)   Peds negative pediatric ROS (+)  Hematology  (+) anemia ,   Anesthesia Other Findings   Reproductive/Obstetrics negative OB ROS                            Anesthesia Physical Anesthesia Plan  ASA: II  Anesthesia Plan: General   Post-op Pain Management:    Induction: Intravenous  PONV Risk Score and Plan: 1 and Ondansetron and Treatment may vary due to age or medical condition  Airway Management Planned: LMA  Additional Equipment:   Intra-op Plan:   Post-operative Plan: Extubation in OR  Informed Consent: I have reviewed the patients History and Physical, chart, labs and discussed the procedure including the risks, benefits and alternatives for the proposed anesthesia with the patient or authorized representative who has indicated his/her understanding and acceptance.     Dental advisory given  Plan Discussed with: Anesthesiologist and CRNA  Anesthesia Plan Comments:        Anesthesia Quick Evaluation

## 2020-05-24 NOTE — Progress Notes (Signed)
Weight and vitals stable. Denies pain. Pre seed IPSS 11. Post seed IPSS 11. Denies dysuria. Denies urinary leakage or incontinence. Reports scant bright red hematuria with initial void following implant. Denies diarrhea or other bowel complaints. Scheduled to follow up with urologist on December 23. Patient understands to follow up with urologist in [place] with any issues s/p implant today. Patient scheduled to follow up with radiation oncology in [place] on January 13.   BP (!) 144/91 (BP Location: Right Arm, Patient Position: Sitting, Cuff Size: Large)    Pulse 90    Temp 98.3 F (36.8 C)    Resp 18    Ht 5\' 6"  (1.676 m)    Wt 188 lb 12.8 oz (85.6 kg)    SpO2 99%    BMI 30.47 kg/m  Wt Readings from Last 3 Encounters:  05/24/20 188 lb 12.8 oz (85.6 kg)  05/24/20 188 lb 12.8 oz (85.6 kg)  04/29/20 187 lb (84.8 kg)

## 2020-05-24 NOTE — Anesthesia Postprocedure Evaluation (Signed)
Anesthesia Post Note  Patient: Anthony Stokes  Procedure(s) Performed: RADIOACTIVE SEED IMPLANT/BRACHYTHERAPY IMPLANT (N/A Perineum) SPACE OAR INSTILLATION (N/A Perineum) CYSTOSCOPY (N/A Bladder)     Patient location during evaluation: PACU Anesthesia Type: General Level of consciousness: awake and alert Pain management: pain level controlled Vital Signs Assessment: post-procedure vital signs reviewed and stable Respiratory status: spontaneous breathing, nonlabored ventilation, respiratory function stable and patient connected to nasal cannula oxygen Cardiovascular status: blood pressure returned to baseline and stable Postop Assessment: no apparent nausea or vomiting Anesthetic complications: no   No complications documented.  Last Vitals:  Vitals:   05/24/20 1334 05/24/20 1345  BP:  (!) 153/99  Pulse: 89 92  Resp: 16 18  Temp:    SpO2: 96% 96%    Last Pain:  Vitals:   05/24/20 1334  TempSrc:   PainSc: 7                  Candra R Malissia Rabbani

## 2020-05-24 NOTE — Interval H&P Note (Signed)
History and Physical Interval Note:  05/24/2020 10:01 AM  Anthony Stokes  has presented today for surgery, with the diagnosis of PROSTATE CANCER.  The various methods of treatment have been discussed with the patient and family. After consideration of risks, benefits and other options for treatment, the patient has consented to  Procedure(s) with comments: RADIOACTIVE SEED IMPLANT/BRACHYTHERAPY IMPLANT (N/A) - 90 MINS SPACE OAR INSTILLATION (N/A) as a surgical intervention.  The patient's history has been reviewed, patient examined, no change in status, stable for surgery.  I have reviewed the patient's chart and labs.  Questions were answered to the patient's satisfaction.     Remi Haggard

## 2020-05-24 NOTE — Op Note (Signed)
Operative report  Preop diagnosis: Adenocarcinoma the prostate Postop diagnosis: Adenocarcinoma the prostate Procedure: Iodine 125 interstitial brachytherapy, SpaceOAR gel implant Surgeon: Milford Cage Assistant: Tammi Klippel Anesthesia: General Operative findings dosimetry was 110 Gy, 16 needles and 70 seeds implanted, SpaceOAR gel implant also placed without difficulty Estimated blood loss minimal Operative note: After obtaining for consent for the patient is taken the major OR suite placed under general anesthesia.  Placed in the dorsolithotomy position genitalia prepped and draped in usual sterile fashion.  Foley catheter was placed in the bladder.  Transrectal ultrasound probe was placed by Dr. Tammi Klippel and underwent volume measurements and dosimetry curves to calculate number of needles and seeds to be implanted in a peripheral loading fashion.  After the dosimetry curves were calculated the implant grid was placed up against the perineum.  Utilizing the calculated locations needles were passed transperineally under ultrasound guidance to implant 70 seeds utilizing 16 needles throughout the prostate and a peripheral loading fashion.   Attention was then directed towards the SpaceOAR gel implant.  A needle was passed in the perineal area under ultrasound guidance down beneath the obviates fascia and the plane between the rectum and the prostate.  Utilizing irrigation fluid the needle was directed into the proper space and confirmed with a puff of saline to show good placement of the tip of the needle.  The SpaceOAR gel was then injected after confirming with aspiration that we are not an avascular area.  Gel implant appeared to be in proper location based on ultrasound findings.  The injection needle was removed and Foley catheter was subsequently removed.  Flexible cystoscopy was performed and there were no evidence of seeds within the urethra prostatic urethra or in the bladder.  Scope was removed and 16  Foley was replaced at termination of the case.  Was placed to gravity drain.  Seizure was terminated he was awakened from anesthesia and taken back to the recovery room in stable condition.  No immediate complication from the procedure.

## 2020-05-25 ENCOUNTER — Encounter (HOSPITAL_BASED_OUTPATIENT_CLINIC_OR_DEPARTMENT_OTHER): Payer: Self-pay | Admitting: Urology

## 2020-05-26 NOTE — Progress Notes (Signed)
  Radiation Oncology         [phone] ________________________________  Name: Anthony Stokes MRN: [id number]  Date: 05/24/2020  DOB: 09-Mar-1961  Follow-Up Visit Note  CC: White, Fortino Sic, LMFT  Meda Klinefelter, MD  Diagnosis:   59 y.o. gentleman with Stage T1C adenocarcinoma of the prostate with Gleason Score of 4+4, and PSA of 27.71.    ICD-10-CM   1. Prostate cancer ([place])  C61     Interval Since Last Radiation:  3 hours  Narrative:  The patient returns today for routine follow-up.  He is complaining of increased urinary frequency and urinary hesitation symptoms. He filled out a questionnaire regarding urinary function today providing and overall IPSS score of 11 characterizing his symptoms as moderate.  His pre-implant score was 11. He denies any bowel symptoms.  ALLERGIES:  has No Known Allergies.  Meds: Current Outpatient Medications  Medication Sig Dispense Refill  . atorvastatin (LIPITOR) 20 MG tablet Take by mouth.    . bicalutamide (CASODEX) 50 MG tablet Take by mouth.    Marland Kitchen lisinopril (ZESTRIL) 20 MG tablet Take by mouth.    . tamsulosin (FLOMAX) 0.4 MG CAPS capsule Take 1 capsule (0.4 mg total) by mouth daily. 30 capsule 0  . traMADol (ULTRAM) 50 MG tablet Take 1 tablet (50 mg total) by mouth every 6 (six) hours as needed. 20 tablet 0   No current facility-administered medications for this encounter.    Physical Findings: The patient is in no acute distress. Patient is alert and oriented.  height is 5\' 6"  (1.676 m) and weight is 188 lb 12.8 oz (85.6 kg). His temperature is 98.3 F (36.8 C). His blood pressure is 144/91 (abnormal) and his pulse is 90. His respiration is 18 and oxygen saturation is 99%. .  No significant changes.  Lab Findings: Lab Results  Component Value Date   WBC 4.3 05/17/2020   HGB 11.5 (L) 05/17/2020   HCT 34.1 (L) 05/17/2020   MCV 91.4 05/17/2020   PLT 255 05/17/2020    Radiographic Findings:  Patient underwent CT imaging in  our clinic for post implant dosimetry. The CT appears to demonstrate an adequate distribution of radioactive seeds throughout the prostate gland. There no seeds in her near the rectum. I suspect the final radiation plan and dosimetry will show appropriate coverage of the prostate gland.   Impression: The patient is recovering from the effects of radiation. His urinary symptoms should gradually improve over the next 4-6 months. We talked about this today. He is encouraged by his improvement already and is otherwise please with his outcome.   Plan: Today, I spent time talking to the patient about his prostate seed implant and resolving urinary symptoms. We also talked about long-term follow-up for prostate cancer following seed implant. He understands that ongoing PSA determinations and digital rectal exams will help perform surveillance to rule out disease recurrence. He understands what to expect with his PSA measures. Patient was also educated today about some of the long-term effects from radiation including a small risk for rectal bleeding and possibly erectile dysfunction. We talked about some of the general management approaches to these potential complications. However, I did encourage the patient to contact our office or return at any point if he has questions or concerns related to his previous radiation and prostate cancer.  The patient will return to [facility] for further follow-up with his local medical team.  _____________________________________  Sheral Apley. Tammi Klippel, M.D.

## 2020-05-26 NOTE — Progress Notes (Signed)
  Radiation Oncology         [phone]) [phone] ________________________________  Name: Anthony Stokes MRN: [id number]  Date: 05/26/2020  DOB: 07-Mar-1961       Prostate Seed Implant  CC:White, Christine J, LMFT  No ref. provider found  DIAGNOSIS:  Oncology History  Prostate cancer ([place])  09/04/2019 Cancer Staging   Staging form: Prostate, AJCC 8th Edition - Clinical stage from 09/04/2019: Stage IIIA (cT1c, cN0, cM0, PSA: 27, Grade Group: 4) - Signed by Freeman Caldron, PA-C on 04/29/2020   03/11/2020 Initial Diagnosis   Prostate cancer ([place])       ICD-10-CM   1. Pre-op evaluation  Z01.818 DG Chest 2 View    DG Chest 2 View    PROCEDURE: Insertion of radioactive I-125 seeds into the prostate gland.  RADIATION DOSE: 110 Gy, boost therapy.  TECHNIQUE: [name] was brought to the operating room with the urologist. He was placed in the dorsolithotomy position. He was catheterized and a rectal tube was inserted. The perineum was shaved, prepped and draped. The ultrasound probe was then introduced into the rectum to see the prostate gland.  TREATMENT DEVICE: A needle grid was attached to the ultrasound probe stand and anchor needles were placed.  3D PLANNING: The prostate was imaged in 3D using a sagittal sweep of the prostate probe. These images were transferred to the planning computer. There, the prostate, urethra and rectum were defined on each axial reconstructed image. Then, the software created an optimized 3D plan and a few seed positions were adjusted. The quality of the plan was reviewed using [facility] information for the target and the following two organs at risk:  Urethra and Rectum.  Then the accepted plan was printed and handed off to the radiation therapist.  Under my supervision, the custom loading of the seeds and spacers was carried out and loaded into sealed vicryl sleeves.  These pre-loaded needles were then placed into the needle holder.Marland Kitchen  PROSTATE VOLUME STUDY:  Using  transrectal ultrasound the volume of the prostate was verified to be 19.3 cc.  SPECIAL TREATMENT PROCEDURE/SUPERVISION AND HANDLING: The pre-loaded needles were then delivered under sagittal guidance. A total of 17 needles were used to deposit 60 seeds in the prostate gland. The individual seed activity was 0.243 mCi.  SpaceOAR:  Yes  COMPLEX SIMULATION: At the end of the procedure, an anterior radiograph of the pelvis was obtained to document seed positioning and count. Cystoscopy was performed to check the urethra and bladder.  MICRODOSIMETRY: At the end of the procedure, the patient was emitting 0.056 mR/hr at 1 meter. Accordingly, he was considered safe for hospital discharge.  PLAN: The patient will return to the radiation oncology clinic for post implant CT dosimetry in three weeks.   ________________________________  Sheral Apley Tammi Klippel, M.D.

## 2020-05-26 NOTE — Progress Notes (Signed)
  Radiation Oncology         [phone]) [phone] ________________________________  Name: Anthony Stokes MRN: [id number]  Date: 05/24/2020  DOB: 1961/03/27  COMPLEX SIMULATION NOTE  NARRATIVE:  The patient was brought to the [facility] today following prostate seed implantation approximately one month ago.  Identity was confirmed.  All relevant records and images related to the planned course of therapy were reviewed.  Then, the patient was set-up supine.  CT images were obtained.  The CT images were loaded into the planning software.  Then the prostate and rectum were contoured.  Treatment planning then occurred.  The implanted iodine 125 seeds were identified by the physics staff for projection of radiation distribution  I have requested : 3D Simulation  I have requested a DVH of the following structures: Prostate and rectum.    ________________________________  Sheral Apley Tammi Klippel, M.D.

## 2020-06-01 ENCOUNTER — Ambulatory Visit
Admission: RE | Admit: 2020-06-01 | Discharge: 2020-06-01 | Disposition: A | Discharge status: Home / Self Care | Payer: No Typology Code available for payment source | Source: Ambulatory Visit | Attending: Radiation Oncology | Admitting: Radiation Oncology

## 2020-06-01 ENCOUNTER — Encounter: Payer: Self-pay | Admitting: Radiation Oncology

## 2020-06-01 DIAGNOSIS — C61 Malignant neoplasm of prostate: Secondary | ICD-10-CM | POA: Diagnosis present

## 2020-06-02 NOTE — Progress Notes (Signed)
  Radiation Oncology         [phone] ________________________________  Name: Anthony Stokes MRN: [id number]  Date: 06/01/2020  DOB: Feb 16, 1961  3D Planning Note   Prostate Brachytherapy Post-Implant Dosimetry  Diagnosis: 59 y.o. gentleman with Stage T1c adenocarcinoma of the prostate with Gleason Score of 4+4, and PSA of 27.71  Narrative: On a previous date, Anthony Stokes returned following prostate seed implantation for post implant planning. He underwent CT scan complex simulation to delineate the three-dimensional structures of the pelvis and demonstrate the radiation distribution.  Since that time, the seed localization, and complex isodose planning with dose volume histograms have now been completed.  Results:   Prostate Coverage - The dose of radiation delivered to the 90% or more of the prostate gland (D90) was 106.58% of the prescription dose. This exceeds our goal of greater than 90%. Rectal Sparing - The volume of rectal tissue receiving the prescription dose or higher was 0.0 cc. This falls under our thresholds tolerance of 1.0 cc.  Impression: The prostate seed implant appears to show adequate target coverage and appropriate rectal sparing.  Plan:  The patient will continue to follow with urology for ongoing PSA determinations. I would anticipate a high likelihood for local tumor control with minimal risk for rectal morbidity.  ________________________________  Sheral Apley Tammi Klippel, M.D.

## 2020-06-03 ENCOUNTER — Telehealth: Payer: Self-pay | Admitting: *Deleted

## 2020-06-03 NOTE — Telephone Encounter (Signed)
RETURNED PATIENT'S PHONE CALL, SPOKE WITH PATIENT. ?

## 2020-08-20 ENCOUNTER — Encounter (HOSPITAL_BASED_OUTPATIENT_CLINIC_OR_DEPARTMENT_OTHER): Payer: Self-pay | Admitting: Urology

## 2021-05-25 IMAGING — DX DG CHEST 2V
2 series · 2 of 2 positions shown · non-contrast
Comparison: None.

CLINICAL DATA: Preop seed implant.

EXAM:
CHEST - 2 VIEW

[chest pa]
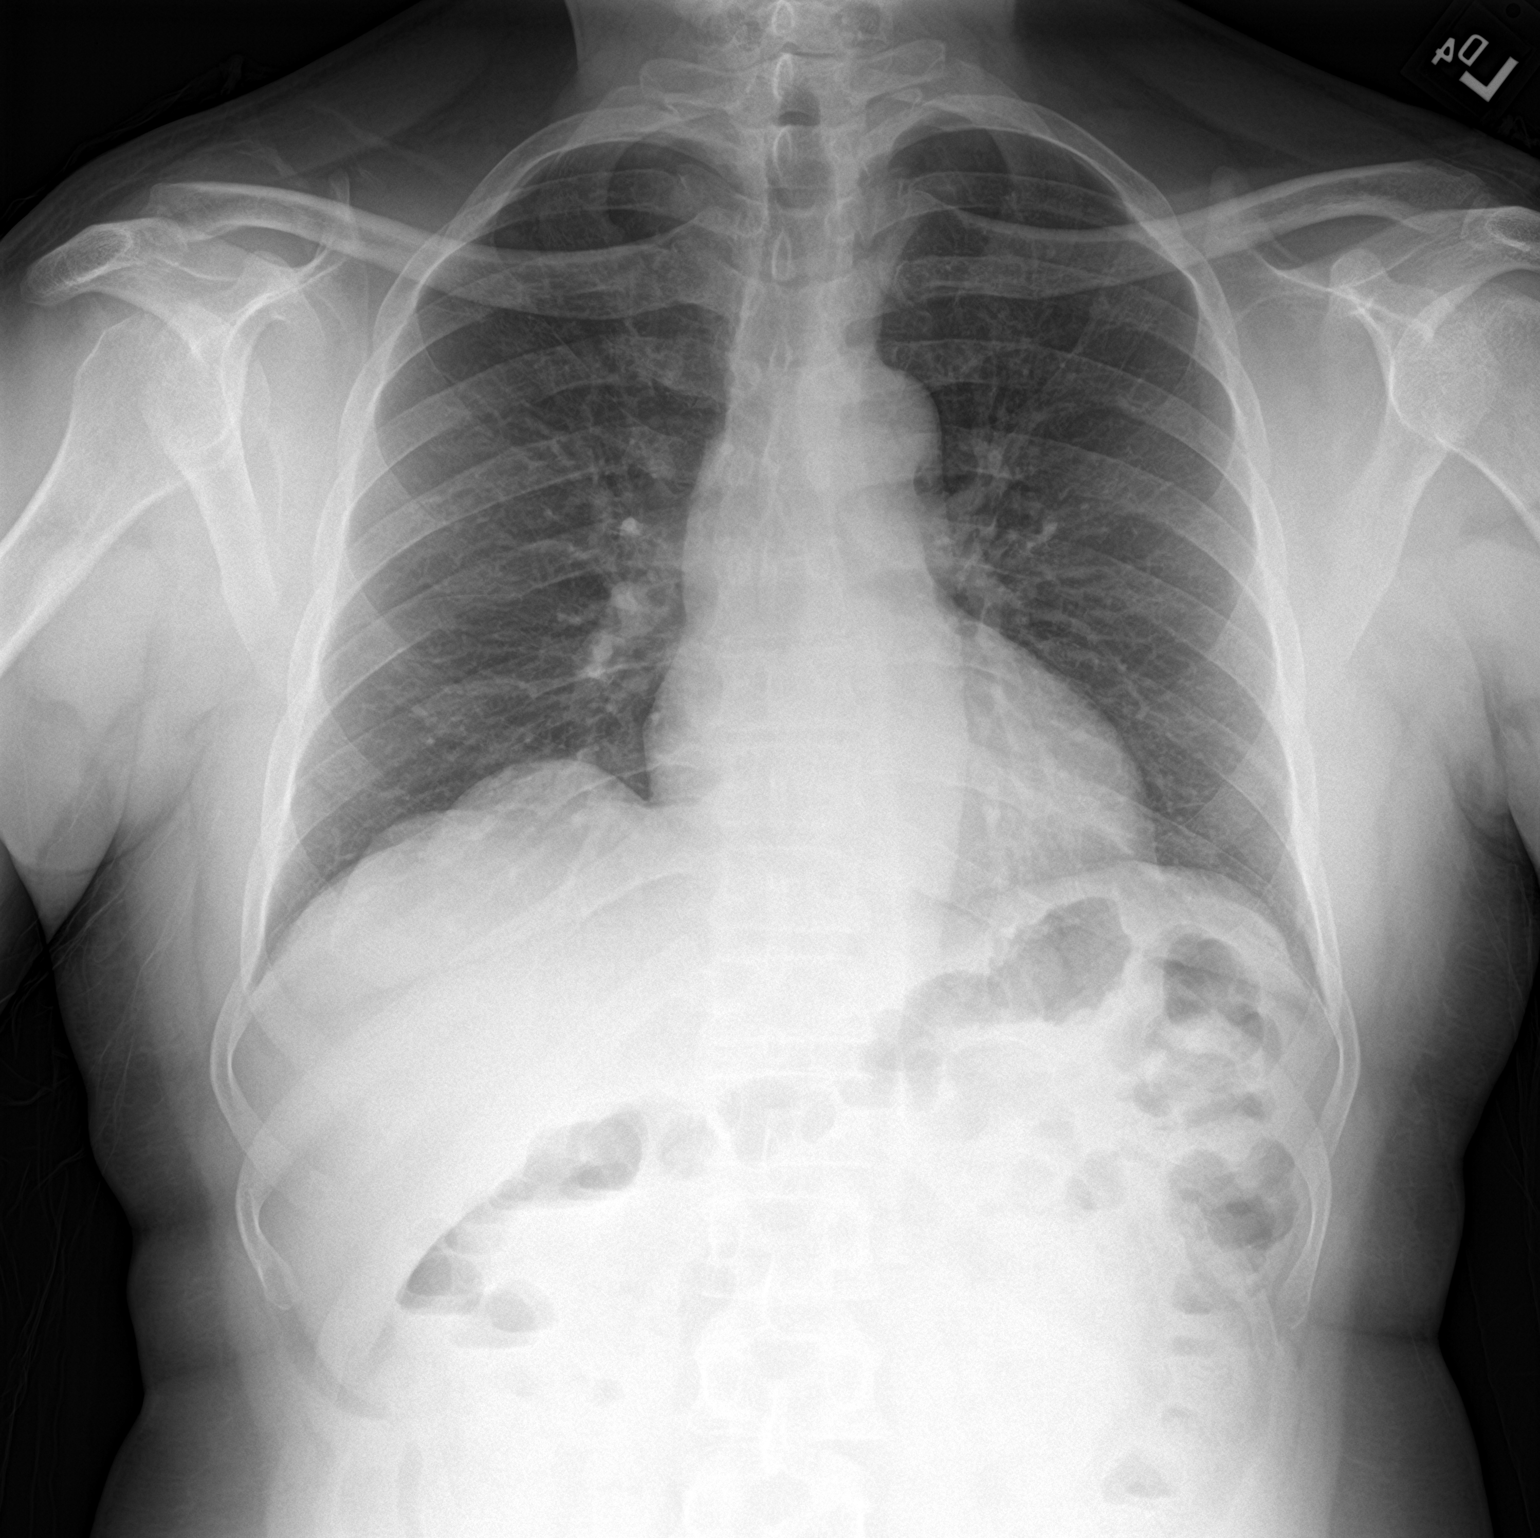

[chest lat]
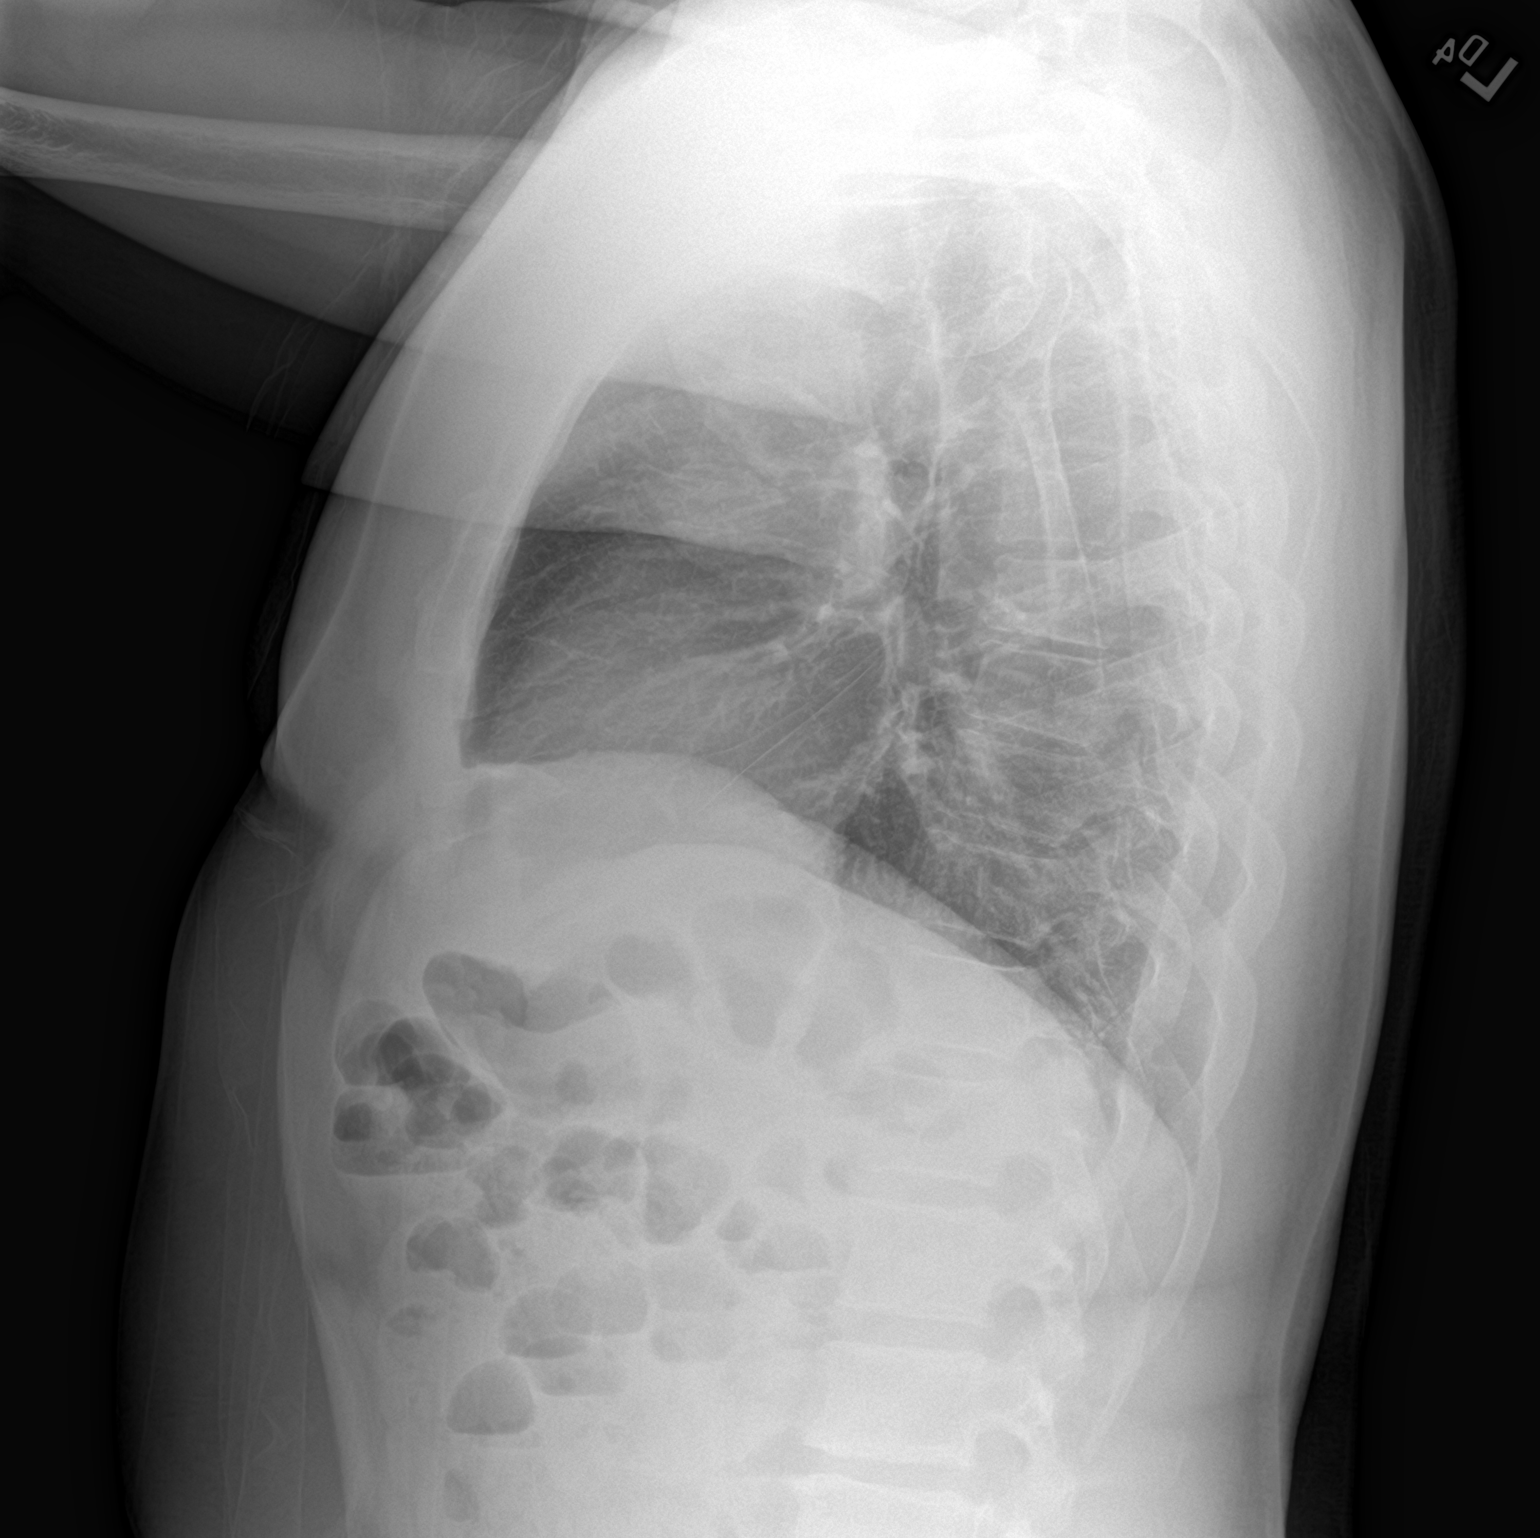

[2 of 2 positions shown; findings below may reference images not displayed]

FINDINGS: The heart size and mediastinal contours are within normal limits.
Both lungs are clear. No visible pleural effusions or pneumothorax.
The visualized skeletal structures are unremarkable.
IMPRESSION: No active cardiopulmonary disease.

## 2022-09-13 ENCOUNTER — Ambulatory Visit: Payer: Non-veteran care | Admitting: Dermatology
